# Patient Record
Sex: Female | Born: 1995 | Race: White | Hispanic: No | Marital: Single | Smoking: Never smoker
Health system: Southern US, Community
[De-identification: ages and names within clinical notes are randomized; demographics above are authoritative.]

## PROBLEM LIST (undated history)

## (undated) DIAGNOSIS — F988 Other specified behavioral and emotional disorders with onset usually occurring in childhood and adolescence: Secondary | ICD-10-CM

## (undated) HISTORY — DX: Other specified behavioral and emotional disorders with onset usually occurring in childhood and adolescence: F98.8

## (undated) HISTORY — PX: LASER RESURFACING: SHX1954

## (undated) HISTORY — PX: TYMPANOSTOMY TUBE PLACEMENT: SHX32

---
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
## 2015-01-08 ENCOUNTER — Other Ambulatory Visit: Payer: Self-pay | Admitting: Internal Medicine

## 2015-02-07 ENCOUNTER — Other Ambulatory Visit: Payer: Self-pay | Admitting: Internal Medicine

## 2015-02-07 NOTE — Telephone Encounter (Signed)
Spoke to patient's mother, advised on need for appt. States pt will have to call us back to schedule an appointment.

## 2015-03-04 ENCOUNTER — Encounter: Payer: Self-pay | Admitting: Internal Medicine

## 2015-03-04 ENCOUNTER — Ambulatory Visit (INDEPENDENT_AMBULATORY_CARE_PROVIDER_SITE_OTHER): Payer: BLUE CROSS/BLUE SHIELD | Admitting: Internal Medicine

## 2015-03-04 VITALS — BP 95/56 | HR 51 | Temp 97.9°F | Ht 65.5 in | Wt 189.4 lb

## 2015-03-04 DIAGNOSIS — Z0000 Encounter for general adult medical examination without abnormal findings: Secondary | ICD-10-CM

## 2015-03-04 MED ORDER — PROPRANOLOL HCL ER 80 MG PO CP24: ORAL_CAPSULE | ORAL | Status: DC

## 2015-03-04 NOTE — Progress Notes (Signed)
   Subjective:    Patient ID: Monica King, female    DOB: 10/03/1995, 19 y.o.   MRN: [id number]  HPI  19YO female presents for physical exam.  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 Lives at home. Working at [employer]. Eating relatively healthy diet. Exercising occasionally, limited by work. Very active at work. Having regular menses. No heavy bleeding.   Past medical, surgical, family and social history per today's encounter.  Review of Systems  Constitutional: Negative for fever, chills, appetite change, fatigue and unexpected weight change.  Eyes: Negative for visual disturbance.  Respiratory: Negative for shortness of breath.   Cardiovascular: Negative for chest pain and leg swelling.  Gastrointestinal: Negative for nausea, vomiting, abdominal pain, diarrhea and constipation.  Musculoskeletal: Negative for myalgias and arthralgias.  Skin: Negative for color change and rash.  Hematological: Negative for adenopathy. Does not bruise/bleed easily.  Psychiatric/Behavioral: Negative for sleep disturbance and dysphoric mood. The patient is not nervous/anxious.        Objective:    BP 95/56 mmHg  Pulse 51  Temp(Src) 97.9 F (36.6 C) (Oral)  Ht 5' 5.5" (1.664 m)  Wt 189 lb 6 oz (85.9 kg)  BMI 31.02 kg/m2  SpO2 100%  LMP 02/26/2015 Physical Exam  Constitutional: She is oriented to person, place, and time. She appears well-developed and well-nourished. No distress.  HENT:  Head: Normocephalic and atraumatic.  Right Ear: External ear normal.  Left Ear: External ear normal.  Nose: Nose normal.  Mouth/Throat: Oropharynx is clear and moist. No oropharyngeal exudate.  Eyes: Conjunctivae and EOM are normal. Pupils are equal, round, and reactive to light. Right eye exhibits no discharge.  Neck: Normal range of motion. Neck supple. No thyromegaly present.  Cardiovascular: Normal rate, regular rhythm, normal heart sounds and intact distal pulses.  Exam reveals no gallop and  no friction rub.   No murmur heard. Pulmonary/Chest: Effort normal. No respiratory distress. She has no wheezes. She has no rales.  Abdominal: Soft. Bowel sounds are normal. She exhibits no distension and no mass. There is no tenderness. There is no rebound and no guarding.  Musculoskeletal: Normal range of motion. She exhibits no edema or tenderness.  Lymphadenopathy:    She has no cervical adenopathy.  Neurological: She is alert and oriented to person, place, and time. No cranial nerve deficit. Coordination normal.  Skin: Skin is warm and dry. No rash noted. She is not diaphoretic. No erythema. No pallor.     Psychiatric: She has a normal mood and affect. Her behavior is normal. Judgment and thought content normal.          Assessment & Plan:   Problem List Items Addressed This Visit      Unprioritized   Routine general medical examination at a health care facility - Primary    General medical exam normal today. Discussed screening guidelines for cervical cancer, plan to start PAPs at age 83. Recommended labs with CBC and lipids. She would like to hold off for now and set up fasting labs. Encouraged her to get a flu vaccine this year when available. Discussed preventative health measures including avoiding texting while driving and using barrier protection during sexual intercourse. She is not currently sexually active. Encouraged healthy diet and exercise. Follow up in 1 year and prn.          Return in about 1 year (around 03/03/2016) for Physical.

## 2015-03-04 NOTE — Assessment & Plan Note (Signed)
General medical exam normal today. Discussed screening guidelines for cervical cancer, plan to start PAPs at age 19. Recommended labs with CBC and lipids. She would like to hold off for now and set up fasting labs. Encouraged her to get a flu vaccine this year when available. Discussed preventative health measures including avoiding texting while driving and using barrier protection during sexual intercourse. She is not currently sexually active. Encouraged healthy diet and exercise. Follow up in 1 year and prn.

## 2015-03-04 NOTE — Progress Notes (Signed)
Pre visit review using our clinic review tool, if applicable. No additional management support is needed unless otherwise documented below in the visit note. 

## 2015-03-04 NOTE — Patient Instructions (Signed)

## 2016-01-21 ENCOUNTER — Ambulatory Visit (INDEPENDENT_AMBULATORY_CARE_PROVIDER_SITE_OTHER): Payer: BLUE CROSS/BLUE SHIELD | Admitting: Family Medicine

## 2016-01-21 VITALS — BP 119/83 | HR 97 | Temp 97.9°F | Ht 66.0 in | Wt 200.5 lb

## 2016-01-21 DIAGNOSIS — H6693 Otitis media, unspecified, bilateral: Secondary | ICD-10-CM | POA: Diagnosis not present

## 2016-01-21 DIAGNOSIS — H6690 Otitis media, unspecified, unspecified ear: Secondary | ICD-10-CM | POA: Insufficient documentation

## 2016-01-21 MED ORDER — CEFDINIR 300 MG PO CAPS: 300.0000 mg | ORAL_CAPSULE | Freq: Two times a day (BID) | ORAL | Status: DC

## 2016-01-21 NOTE — Progress Notes (Signed)
   Subjective:  Patient ID: Monica King, female    DOB: 1995-10-08  Age: 20 y.o. MRN: [id number]  CC: Headache, sinus pressure, difficulty hearing  HPI:  20 year old female presents with the above complaints.  Patient states that she's been sick for the past week. Her predominant symptom currently is the fact that she's had difficulty hearing. She reports associated headache and sinus pressure recently as well. She was recently seen in urgent care and was told that this was viral and was advised to use supportive care. No associated fevers or chills. No known exacerbating or relieving factors. No complaints this time.  Social Hx   Social History   Social History  . Marital Status: Single    Spouse Name: N/A  . Number of Children: 0  . Years of Education: N/A   Occupational History  . Student - [employer] (08/2011)    Social History Main Topics  . Smoking status: Never Smoker   . Smokeless tobacco: Never Used  . Alcohol Use: No  . Drug Use: No  . Sexual Activity: Not on file   Other Topics Concern  . Not on file   Social History Narrative   Lives in [place]. 10th grade, ROTC, [employer]   Review of Systems  Constitutional: Negative for fever.  HENT: Positive for hearing loss and sinus pressure.   Neurological: Positive for headaches.   Objective:  BP 119/83 mmHg  Pulse 97  Temp(Src) 97.9 F (36.6 C)  Ht 5\' 6"  (1.676 m)  Wt 200 lb 8 oz (90.946 kg)  BMI 32.38 kg/m2  SpO2 99%  BP/Weight 01/21/2016 [id number]  Systolic BP 123456 95 [id number]  Diastolic BP 83 56 60  Wt. (Lbs) 200.5 189.38 -  BMI 32.38 31.02 -   Physical Exam  Constitutional: She is oriented to person, place, and time. She appears well-developed. No distress.  HENT:  Head: Normocephalic and atraumatic.  Mouth/Throat: Oropharynx is clear and moist.  Right TM with bulging and erythema. Left TM with severe erythema and bulging as well. Loss of landmarks.  Eyes: Conjunctivae are normal.  Neck: Neck  supple.  Neurological: She is alert and oriented to person, place, and time.  Psychiatric: She has a normal mood and affect.  Vitals reviewed.  Assessment & Plan:   Problem List Items Addressed This Visit    Otitis media - Primary    New acute problem. Given prior history of recurrent OM, treating with Omnicef.      Relevant Medications   cefdinir (OMNICEF) 300 MG capsule     Meds ordered this encounter  Medications  . cefdinir (OMNICEF) 300 MG capsule    Sig: Take 1 capsule (300 mg total) by mouth 2 (two) times daily.    Dispense:  20 capsule    Refill:  0   Follow-up: PRN  [place]

## 2016-01-21 NOTE — Progress Notes (Signed)
Pre visit review using our clinic review tool, if applicable. No additional management support is needed unless otherwise documented below in the visit note. 

## 2016-01-21 NOTE — Assessment & Plan Note (Signed)
New acute problem. Given prior history of recurrent OM, treating with Omnicef.

## 2016-01-21 NOTE — Patient Instructions (Signed)
Take the medication as prescribed.  Follow up as needed.  Take care  Dr. Lacinda Axon

## 2016-02-10 ENCOUNTER — Encounter: Payer: Self-pay | Admitting: Family

## 2016-02-10 ENCOUNTER — Ambulatory Visit (INDEPENDENT_AMBULATORY_CARE_PROVIDER_SITE_OTHER): Payer: BLUE CROSS/BLUE SHIELD | Admitting: Family

## 2016-02-10 VITALS — BP 100/78 | HR 80 | Temp 97.9°F | Ht 66.0 in | Wt 201.2 lb

## 2016-02-10 DIAGNOSIS — R05 Cough: Secondary | ICD-10-CM

## 2016-02-10 DIAGNOSIS — R059 Cough, unspecified: Secondary | ICD-10-CM

## 2016-02-10 MED ORDER — PREDNISONE 10 MG PO TABS: ORAL_TABLET | ORAL | Status: AC

## 2016-02-10 MED ORDER — ALBUTEROL SULFATE HFA 108 (90 BASE) MCG/ACT IN AERS: 2.0000 | INHALATION_SPRAY | Freq: Four times a day (QID) | RESPIRATORY_TRACT | Status: AC | PRN

## 2016-02-10 NOTE — Progress Notes (Signed)
Subjective:    Patient ID: LAYNA FLAKE, female    DOB: February 04, 1996, 20 y.o.   MRN: [id number]   ARLYN YOKE is a 20 y.o. female who presents today for an acute visit.    HPI Comments: Patient here for evaluation of productive cough for one month. Barky.Endorses bilateral ears popping. No wheezing, SOB, fever. Has tried mucinex twice with no relief. Had asthma as a child and seasonal allergies. Has had inhalers in the past. Cough worse in the morning and when trying to sleep. Cough better during day while at work, outside.  Seen 6/21 for otitis media and treated with omnicef , largely resolved.   Past Medical History  Diagnosis Date  . ADD (attention deficit disorder)     Dx 4th grade   Allergies: Review of patient's allergies indicates no known allergies. Current Outpatient Prescriptions on File Prior to Visit  Medication Sig Dispense Refill  . amphetamine-dextroamphetamine (ADDERALL XR) 25 MG 24 hr capsule Take 25 mg by mouth every morning.    . lisdexamfetamine (VYVANSE) 40 MG capsule Take 40 mg by mouth every morning.    . propranolol ER (INDERAL LA) 80 MG 24 hr capsule TAKE 1 CAPSULE (80 MG TOTAL) BY MOUTH DAILY. 30 capsule 11  . amphetamine-dextroamphetamine (ADDERALL) 5 MG tablet Take 5 mg by mouth daily. Reported on 02/10/2016     No current facility-administered medications on file prior to visit.    Social History  Substance Use Topics  . Smoking status: Never Smoker   . Smokeless tobacco: Never Used  . Alcohol Use: No    Review of Systems  Constitutional: Negative for fever and chills.  Respiratory: Positive for cough. Negative for shortness of breath and wheezing.   Cardiovascular: Negative for chest pain and palpitations.  Gastrointestinal: Negative for nausea and vomiting.      Objective:    BP 100/78 mmHg  Pulse 80  Temp(Src) 97.9 F (36.6 C) (Oral)  Ht 5\' 6"  (1.676 m)  Wt 201 lb 3.2 oz (91.264 kg)  BMI 32.49 kg/m2  SpO2 97%   Physical Exam    Constitutional: She appears well-developed and well-nourished.  HENT:  Head: Normocephalic and atraumatic.  Right Ear: Hearing, external ear and ear canal normal. No drainage, swelling or tenderness. No foreign bodies. Tympanic membrane is bulging. Tympanic membrane is not erythematous. No middle ear effusion. No decreased hearing is noted.  Left Ear: Hearing, external ear and ear canal normal. No drainage, swelling or tenderness. No foreign bodies. Tympanic membrane is bulging. Tympanic membrane is not erythematous.  No middle ear effusion. No decreased hearing is noted.  Nose: Nose normal. No rhinorrhea. Right sinus exhibits no maxillary sinus tenderness and no frontal sinus tenderness. Left sinus exhibits no maxillary sinus tenderness and no frontal sinus tenderness.  Mouth/Throat: Uvula is midline, oropharynx is clear and moist and mucous membranes are normal. No oropharyngeal exudate, posterior oropharyngeal edema, posterior oropharyngeal erythema or tonsillar abscesses.  Scarring bilateral tympanic membranes.  Eyes: Conjunctivae are normal.  Cardiovascular: Regular rhythm, normal heart sounds and normal pulses.   Pulmonary/Chest: Effort normal and breath sounds normal. She has no wheezes. She has no rhonchi. She has no rales.  Lymphadenopathy:       Head (right side): No submental, no submandibular, no tonsillar, no preauricular, no posterior auricular and no occipital adenopathy present.       Head (left side): No submental, no submandibular, no tonsillar, no preauricular, no posterior auricular and no occipital adenopathy present.  She has no cervical adenopathy.  Neurological: She is alert.  Skin: Skin is warm and dry.  Psychiatric: She has a normal mood and affect. Her speech is normal and behavior is normal. Thought content normal.  Vitals reviewed.      Assessment & Plan:   1. Cough Suspect viral v allergic etiology. Remote h/o asthma. Trial of prednisone taper which should  relieve ear pressure as well.   - predniSONE (DELTASONE) 10 MG tablet; Take 4 tablets ( total 40 mg) by mouth for 2 days; take 3 tablets ( total 30 mg) by mouth for 2 days; take 2 tablets ( total 20 mg) by mouth for 1 day; take 1 tablet ( total 10 mg) by mouth for 1 day.  Dispense: 17 tablet; Refill: 0 - albuterol (PROVENTIL HFA) 108 (90 Base) MCG/ACT inhaler; Inhale 2 puffs into the lungs every 6 (six) hours as needed for wheezing or shortness of breath.  Dispense: 1 Inhaler; Refill: 1    I have discontinued Ms. Koelzer's cefdinir. I am also having her maintain her lisdexamfetamine, amphetamine-dextroamphetamine, amphetamine-dextroamphetamine, and propranolol ER.   No orders of the defined types were placed in this encounter.    Return precautions given.   Start medications as prescribed and explained to patient on After Visit Summary ( AVS). Risks, benefits, and alternatives of the medications and treatment plan prescribed today were discussed, and patient expressed understanding.   Education regarding symptom management and diagnosis given to patient.   Follow-up:Plan follow-up and return precautions given if any worsening symptoms or change in condition.   Continue to follow with Rica Mast, MD for routine health maintenance.   Janalyn Shy and I agreed with plan.   Mable Paris, FNP

## 2016-02-10 NOTE — Patient Instructions (Signed)
Suspect cough is viral versus allergic etiology. Let's try prednisone taper pack and inhaler to see if resolves. It does not resolve with this treatment, please let her office know and we will order imaging.   If there is no improvement in your symptoms, or if there is any worsening of symptoms, or if you have any additional concerns, please return for re-evaluation; or, if we are closed, consider going to the Emergency Room for evaluation if symptoms urgent.

## 2016-03-03 ENCOUNTER — Other Ambulatory Visit: Payer: Self-pay | Admitting: Internal Medicine

## 2016-03-31 ENCOUNTER — Other Ambulatory Visit: Payer: Self-pay

## 2016-03-31 MED ORDER — PROPRANOLOL HCL ER 80 MG PO CP24: 80.0000 mg | ORAL_CAPSULE | Freq: Every day | ORAL | 0 refills | Status: AC

## 2016-03-31 NOTE — Telephone Encounter (Signed)
Medication has been refilled.
